# Patient Record
Sex: Male | Born: 1970 | Race: White | Hispanic: No | Marital: Married | Smoking: Former smoker
Health system: Southern US, Community
[De-identification: ages and names within clinical notes are randomized; demographics above are authoritative.]

## PROBLEM LIST (undated history)

## (undated) DIAGNOSIS — I10 Essential (primary) hypertension: Secondary | ICD-10-CM

## (undated) HISTORY — DX: Essential (primary) hypertension: I10

## (undated) HISTORY — PX: HAND SURGERY: SHX662

---

## 1999-08-15 ENCOUNTER — Ambulatory Visit (HOSPITAL_BASED_OUTPATIENT_CLINIC_OR_DEPARTMENT_OTHER): Admission: RE | Admit: 1999-08-15 | Discharge: 1999-08-15 | Payer: Self-pay | Admitting: Orthopedic Surgery

## 2017-05-09 ENCOUNTER — Emergency Department
Admission: EM | Admit: 2017-05-09 | Discharge: 2017-05-09 | Disposition: A | Discharge status: Home / Self Care | Payer: No Typology Code available for payment source | Attending: Emergency Medicine | Admitting: Emergency Medicine

## 2017-05-09 ENCOUNTER — Emergency Department: Payer: No Typology Code available for payment source

## 2017-05-09 ENCOUNTER — Encounter: Payer: Self-pay | Admitting: Emergency Medicine

## 2017-05-09 DIAGNOSIS — S82401A Unspecified fracture of shaft of right fibula, initial encounter for closed fracture: Secondary | ICD-10-CM | POA: Insufficient documentation

## 2017-05-09 DIAGNOSIS — Z87891 Personal history of nicotine dependence: Secondary | ICD-10-CM | POA: Insufficient documentation

## 2017-05-09 DIAGNOSIS — Y93H9 Activity, other involving exterior property and land maintenance, building and construction: Secondary | ICD-10-CM | POA: Diagnosis not present

## 2017-05-09 DIAGNOSIS — Y999 Unspecified external cause status: Secondary | ICD-10-CM | POA: Insufficient documentation

## 2017-05-09 DIAGNOSIS — Y929 Unspecified place or not applicable: Secondary | ICD-10-CM | POA: Diagnosis not present

## 2017-05-09 DIAGNOSIS — R1011 Right upper quadrant pain: Secondary | ICD-10-CM | POA: Insufficient documentation

## 2017-05-09 DIAGNOSIS — W228XXA Striking against or struck by other objects, initial encounter: Secondary | ICD-10-CM | POA: Diagnosis not present

## 2017-05-09 DIAGNOSIS — R918 Other nonspecific abnormal finding of lung field: Secondary | ICD-10-CM | POA: Insufficient documentation

## 2017-05-09 DIAGNOSIS — S8991XA Unspecified injury of right lower leg, initial encounter: Secondary | ICD-10-CM | POA: Diagnosis present

## 2017-05-09 LAB — CBC WITH DIFFERENTIAL/PLATELET
BASOS ABS: 0 10*3/uL (ref 0–0.1)
BASOS PCT: 0 %
Eosinophils Absolute: 0.1 10*3/uL (ref 0–0.7)
Eosinophils Relative: 1 %
HEMATOCRIT: 45.7 % (ref 40.0–52.0)
HEMOGLOBIN: 15.3 g/dL (ref 13.0–18.0)
LYMPHS PCT: 19 %
Lymphs Abs: 2 10*3/uL (ref 1.0–3.6)
MCH: 30.1 pg (ref 26.0–34.0)
MCHC: 33.5 g/dL (ref 32.0–36.0)
MCV: 90 fL (ref 80.0–100.0)
MONOS PCT: 5 %
Monocytes Absolute: 0.5 10*3/uL (ref 0.2–1.0)
NEUTROS ABS: 7.7 10*3/uL — AB (ref 1.4–6.5)
NEUTROS PCT: 75 %
Platelets: 177 10*3/uL (ref 150–440)
RBC: 5.07 MIL/uL (ref 4.40–5.90)
RDW: 13.1 % (ref 11.5–14.5)
WBC: 10.3 10*3/uL (ref 3.8–10.6)

## 2017-05-09 LAB — COMPREHENSIVE METABOLIC PANEL
ALBUMIN: 4.1 g/dL (ref 3.5–5.0)
ALK PHOS: 72 U/L (ref 38–126)
ALT: 25 U/L (ref 17–63)
AST: 25 U/L (ref 15–41)
Anion gap: 8 (ref 5–15)
BILIRUBIN TOTAL: 1.3 mg/dL — AB (ref 0.3–1.2)
BUN: 23 mg/dL — AB (ref 6–20)
CALCIUM: 8.8 mg/dL — AB (ref 8.9–10.3)
CO2: 24 mmol/L (ref 22–32)
CREATININE: 1.02 mg/dL (ref 0.61–1.24)
Chloride: 104 mmol/L (ref 101–111)
GFR calc Af Amer: >60 mL/min (ref >60)
GFR calc non Af Amer: >60 mL/min (ref >60)
GLUCOSE: 117 mg/dL — AB (ref 65–99)
Potassium: 3.9 mmol/L (ref 3.5–5.1)
Sodium: 136 mmol/L (ref 135–145)
TOTAL PROTEIN: 6.5 g/dL (ref 6.5–8.1)

## 2017-05-09 MED ORDER — IOPAMIDOL (ISOVUE-300) INJECTION 61%
100.0000 mL | Freq: Once | INTRAVENOUS | Status: AC | PRN
  Administered 2017-05-09: 100 mL via INTRAVENOUS

## 2017-05-09 MED ORDER — OXYCODONE-ACETAMINOPHEN 5-325 MG PO TABS: 1.0000 | ORAL_TABLET | Freq: Four times a day (QID) | ORAL | 0 refills | Status: AC | PRN

## 2017-05-09 MED ORDER — IOPAMIDOL (ISOVUE-300) INJECTION 61%: 125.0000 mL | Freq: Once | INTRAVENOUS | Status: DC | PRN
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
## 2017-05-09 NOTE — ED Notes (Signed)
Lab called back and stated they found blood and will be resulting tests in 15 minutes.

## 2017-05-09 NOTE — ED Notes (Signed)
Lab called regarding results, reported that no blood was received. Plan to redraw.
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
## 2017-08-14 ENCOUNTER — Emergency Department: Payer: Self-pay

## 2017-08-14 ENCOUNTER — Other Ambulatory Visit: Payer: Self-pay

## 2017-08-14 ENCOUNTER — Emergency Department
Admission: EM | Admit: 2017-08-14 | Discharge: 2017-08-14 | Disposition: A | Discharge status: Home / Self Care | Payer: Self-pay | Attending: Student in an Organized Health Care Education/Training Program | Admitting: Student in an Organized Health Care Education/Training Program

## 2017-08-14 DIAGNOSIS — R42 Dizziness and giddiness: Secondary | ICD-10-CM | POA: Insufficient documentation

## 2017-08-14 DIAGNOSIS — R5383 Other fatigue: Secondary | ICD-10-CM | POA: Insufficient documentation

## 2017-08-14 DIAGNOSIS — Z87891 Personal history of nicotine dependence: Secondary | ICD-10-CM | POA: Insufficient documentation

## 2017-08-14 LAB — TROPONIN I

## 2017-08-14 LAB — CBC
HCT: 52.6 % — ABNORMAL HIGH (ref 40.0–52.0)
HEMOGLOBIN: 17.6 g/dL (ref 13.0–18.0)
MCH: 29.8 pg (ref 26.0–34.0)
MCHC: 33.5 g/dL (ref 32.0–36.0)
MCV: 88.8 fL (ref 80.0–100.0)
PLATELETS: 209 10*3/uL (ref 150–440)
RBC: 5.93 MIL/uL — ABNORMAL HIGH (ref 4.40–5.90)
RDW: 13.3 % (ref 11.5–14.5)
WBC: 9.1 10*3/uL (ref 3.8–10.6)

## 2017-08-14 LAB — BASIC METABOLIC PANEL
ANION GAP: 11 (ref 5–15)
BUN: 17 mg/dL (ref 6–20)
CALCIUM: 9.5 mg/dL (ref 8.9–10.3)
CO2: 24 mmol/L (ref 22–32)
CREATININE: 0.9 mg/dL (ref 0.61–1.24)
Chloride: 103 mmol/L (ref 101–111)
Glucose, Bld: 118 mg/dL — ABNORMAL HIGH (ref 65–99)
Potassium: 3.9 mmol/L (ref 3.5–5.1)
SODIUM: 138 mmol/L (ref 135–145)

## 2017-08-14 LAB — TSH: TSH: 1.147 u[IU]/mL (ref 0.350–4.500)

## 2017-08-14 MED ORDER — SODIUM CHLORIDE 0.9 % IV BOLUS (SEPSIS)
1000.0000 mL | Freq: Once | INTRAVENOUS | Status: AC
  Administered 2017-08-14: 1000 mL via INTRAVENOUS

## 2017-08-14 NOTE — ED Triage Notes (Signed)
Pt c/o dizziness and lightheaded for the last 6 days - denies any other symptoms

## 2017-08-14 NOTE — Discharge Instructions (Signed)
Be sure to drink plenty of fluids.  Make appointment with your primary care physician.  Return to the ER for any worsening symptoms, questions or any additional concerns.

## 2017-08-14 NOTE — ED Provider Notes (Signed)
[facility] Emergency Department Provider Note    None    (approximate)  I have reviewed the triage vital signs and the nursing notes.   HISTORY  Chief Complaint Dizziness    HPI Wayne Frost is a 47 y.o. male previously healthy presents with chief complaint of lightheadedness particularly in the evening for the last 6 days.  States that it is a generalized feeling of fatigue and intermittent dizziness.  Denies any pains.  No headache.  No shortness of breath or chest pains.  Has been stressed because of work but is never had symptoms like this before.  He does not smoke.  Does not drink.  No numbness or tingling.  Denies any blood in his stools.  No recent fevers.  Was remotely treated for Lyme disease.  History reviewed. No pertinent past medical history. No family history on file. Past Surgical History:  Procedure Laterality Date  . HAND SURGERY Right    There are no active problems to display for this patient.     Prior to Admission medications   Medication Sig Start Date End Date Taking? Authorizing Provider  oxyCODONE-acetaminophen (ROXICET) 5-325 MG tablet Take 1 tablet by mouth every 6 (six) hours as needed. 05/09/17 05/09/18  Arnaldo NatalMalinda, Paul F, MD    Allergies Patient has no known allergies.    Social History Social History   Tobacco Use  . Smoking status: Former Games developermoker  . Smokeless tobacco: Never Used  Substance Use Topics  . Alcohol use: Yes    Comment: occassional   . Drug use: No    Review of Systems Patient denies headaches, rhinorrhea, blurry vision, numbness, shortness of breath, chest pain, edema, cough, abdominal pain, nausea, vomiting, diarrhea, dysuria, fevers, rashes or hallucinations unless otherwise stated above in HPI. ____________________________________________   PHYSICAL EXAM:  VITAL SIGNS: Vitals:   08/14/17 1337 08/14/17 1742  BP: (!) 157/102 (!) 150/85  Pulse: 87 80  Resp: 16 16  Temp: 98.1 F  (36.7 C)   SpO2: 99% 99%    Constitutional: Alert and oriented. Well appearing and in no acute distress. Eyes: Conjunctivae are normal.  Head: Atraumatic. Nose: No congestion/rhinnorhea. Mouth/Throat: Mucous membranes are moist.   Neck: No stridor. Painless ROM.  Cardiovascular: Normal rate, regular rhythm. Grossly normal heart sounds.  Good peripheral circulation. Respiratory: Normal respiratory effort.  No retractions. Lungs CTAB. Gastrointestinal: Soft and nontender. No distention. No abdominal bruits. No CVA tenderness. Genitourinary:  Musculoskeletal: No lower extremity tenderness nor edema.  No joint effusions. Neurologic:  CN- intact.  No facial droop, Normal FNF.  Normal heel to shin.  Sensation intact bilaterally. Normal speech and language. No gross focal neurologic deficits are appreciated. No gait instability. Skin:  Skin is warm, dry and intact. No rash noted. Psychiatric: Mood and affect are normal. Speech and behavior are normal.  ____________________________________________   LABS (all labs ordered are listed, but only abnormal results are displayed)  Results for orders placed or performed during the hospital encounter of 08/14/17 (from the past 24 hour(s))  Basic metabolic panel     Status: Abnormal   Collection Time: 08/14/17  1:39 PM  Result Value Ref Range   Sodium 138 135 - 145 mmol/L   Potassium 3.9 3.5 - 5.1 mmol/L   Chloride 103 101 - 111 mmol/L   CO2 24 22 - 32 mmol/L   Glucose, Bld 118 (H) 65 - 99 mg/dL   BUN 17 6 - 20 mg/dL   Creatinine, Ser  0.90 0.61 - 1.24 mg/dL   Calcium 9.5 8.9 - 16.1 mg/dL   GFR calc non Af Amer >60 >60 mL/min   GFR calc Af Amer >60 >60 mL/min   Anion gap 11 5 - 15  CBC     Status: Abnormal   Collection Time: 08/14/17  1:39 PM  Result Value Ref Range   WBC 9.1 3.8 - 10.6 K/uL   RBC 5.93 (H) 4.40 - 5.90 MIL/uL   Hemoglobin 17.6 13.0 - 18.0 g/dL   HCT 09.6 (H) 04.5 - 40.9 %   MCV 88.8 80.0 - 100.0 fL   MCH 29.8 26.0 - 34.0  pg   MCHC 33.5 32.0 - 36.0 g/dL   RDW 81.1 91.4 - 78.2 %   Platelets 209 150 - 440 K/uL  Troponin I     Status: None   Collection Time: 08/14/17  1:41 PM  Result Value Ref Range   Troponin I <0.03 <0.03 ng/mL  TSH     Status: None   Collection Time: 08/14/17  1:41 PM  Result Value Ref Range   TSH 1.147 0.350 - 4.500 uIU/mL   ____________________________________________  EKG My review and personal interpretation at Time: 13:39   Indication: weakness  Rate: 75  Rhythm: sinus Axis: normal Other: nonspecific t wave abn, no stemi ____________________________________________  RADIOLOGY  I personally reviewed all radiographic images ordered to evaluate for the above acute complaints and reviewed radiology reports and findings.  These findings were personally discussed with the patient.  Please see medical record for radiology report.  ____________________________________________   PROCEDURES  Procedure(s) performed:  Procedures    Critical Care performed: no ____________________________________________   INITIAL IMPRESSION / ASSESSMENT AND PLAN / ED COURSE  Pertinent labs & imaging results that were available during my care of the patient were reviewed by me and considered in my medical decision making (see chart for details).  DDX: Dehydration, hypothyroidism, depression, paraneoplastic syndrome.  Sepsis, pneumonia, CHF, orthostasis  Wayne Frost is a 47 y.o. who presents to the ED with his as described above.  Patient is well-appearing and in no acute distress.  Patient states he is asymptomatic at this time.  Neuro exam is nonfocal and reassuring.  Seems less consistent with TIA based on vague symptoms with no lateralizing features.  No evidence of ACS.  Thyroid was also checked which is normal.  Blood work is otherwise reassuring except that it was noted that he does have slightly concentrated CBC and I do suspect there may be some component of dehydration therefore  patient given IV fluids.  Patient able to ambulate with a steady gait.  Denies any other symptoms after observation here in the ER.  At this point I do believe he stable for further evaluation as an outpatient.  Discussed signs and symptoms for which he should return immediately to the hospital.      ____________________________________________   FINAL CLINICAL IMPRESSION(S) / ED DIAGNOSES  Final diagnoses:  Lightheadedness  Dizziness      NEW MEDICATIONS STARTED DURING THIS VISIT:  Discharge Medication List as of 08/14/2017  5:13 PM       Note:  This document was prepared using Dragon voice recognition software and may include unintentional dictation errors.    Willy Eddy, MD 08/14/17 Zollie Pee
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
## 2021-09-05 ENCOUNTER — Encounter: Payer: Self-pay | Admitting: Licensed Clinical Social Worker

## 2021-09-05 ENCOUNTER — Inpatient Hospital Stay: Payer: Medicaid Other

## 2021-09-05 ENCOUNTER — Other Ambulatory Visit: Payer: Self-pay

## 2021-09-05 ENCOUNTER — Inpatient Hospital Stay: Payer: Medicaid Other | Attending: Oncology | Admitting: Licensed Clinical Social Worker

## 2021-09-05 DIAGNOSIS — Z803 Family history of malignant neoplasm of breast: Secondary | ICD-10-CM

## 2021-09-05 DIAGNOSIS — Z800 Family history of malignant neoplasm of digestive organs: Secondary | ICD-10-CM | POA: Diagnosis not present

## 2021-09-05 NOTE — Progress Notes (Signed)
REFERRING PROVIDER: ?Marval Regal, NP ?[address],  [place] [address] ? ?PRIMARY PROVIDER:  ?Tamsen Roers, MD ? ?PRIMARY REASON FOR VISIT:  ?1. Family history of colon cancer   ? ? ? ?HISTORY OF PRESENT ILLNESS:   ?Wayne Frost, a 51 y.o. male, was seen for a [facility] cancer genetics consultation at the request of Dr. Jerelene Redden due to a family history of colon cancer.  Wayne Frost presents to clinic today to discuss the possibility of a hereditary predisposition to cancer, genetic testing, and to further clarify his future cancer risks, as well as potential cancer risks for family members.  ? ?Wayne Frost is a 51 y.o. male with no personal history of cancer.  He recently had a colonoscopy that showed 1 large polyp and will have another colonoscopy in 3 years. ? ?CANCER HISTORY:  ?Oncology History  ? No history exists.  ? ? ? ?Past Surgical History:  ?Procedure Laterality Date  ? HAND SURGERY Right   ? ? ?Social History  ? ?Socioeconomic History  ? Marital status: Married  ?  Spouse name: Not on file  ? Number of children: Not on file  ? Years of education: Not on file  ? Highest education level: Not on file  ?Occupational History  ? Not on file  ?Tobacco Use  ? Smoking status: Former  ? Smokeless tobacco: Never  ?Vaping Use  ? Vaping Use: Never used  ?Substance and Sexual Activity  ? Alcohol use: Yes  ?  Comment: occassional   ? Drug use: No  ? Sexual activity: Yes  ?Other Topics Concern  ? Not on file  ?Social History Narrative  ? Not on file  ? ?Social Determinants of Health  ? ?Financial Resource Strain: Not on file  ?Food Insecurity: Not on file  ?Transportation Needs: Not on file  ?Physical Activity: Not on file  ?Stress: Not on file  ?Social Connections: Not on file  ?  ? ?FAMILY HISTORY:  ?We obtained a detailed, 4-generation family history.  Significant diagnoses are listed below: ?Family History  ?Problem Relation Age of Onset  ? Colon cancer Mother 68  ? Colon cancer Maternal Aunt   ?  Colon cancer Maternal Uncle   ? Leukemia Paternal Uncle 4  ? Breast cancer Maternal Grandmother   ? Colon cancer Maternal Grandmother   ? Colon cancer Maternal Grandfather   ? ?Wayne Frost has 1 son, 75, and 1 daughter, 58. He has 2 sisters. Both had colonoscopies in their 30s that were normal.  ? ?Wayne Frost's mother was adopted. She had colon cancer and died at 11. Mother's biological sister informed the family that 5 brothers/sisters also died of colon cancer, patient's maternal grandfather died of colon cancer, and maternal grandmother had breast and colon cancer.  ? ?Wayne Frost father is living at 66 and has not had cancer. Patient had 2 uncles. One had leukemia and died at 58, his other uncle is living. No other known cancers on this side of the family. ? ?Mr. Foglio is unaware of previous family history of genetic testing for hereditary cancer risks. Patient's maternal ancestors are of unknown descent, and paternal ancestors are of unknown descent. There is no reported Ashkenazi Jewish ancestry. There is no known consanguinity. ? ? ? ?GENETIC COUNSELING ASSESSMENT: Wayne Frost is a 51 y.o. male with a family history of colon cancer which is somewhat suggestive of a hereditary cancer syndrome and predisposition to cancer. We, therefore, discussed and recommended the  following at today's visit.  ? ?DISCUSSION: We discussed that approximately 10% of colorectal cancer is hereditary. Most cases of hereditary colorectal cancer are associated with Lynch syndrome genes, although there are other genes associated with hereditary cancer as well. Cancers and risks are gene specific. We discussed that testing is beneficial for several reasons including knowing about cancer risks, identifying potential screening and risk-reduction options that may be appropriate, and to understand if other family members could be at risk for cancer and allow them to undergo genetic testing.  ? ?We reviewed the characteristics,  features and inheritance patterns of hereditary cancer syndromes. We also discussed genetic testing, including the appropriate family members to test, the process of testing, insurance coverage and turn-around-time for results. We discussed the implications of a negative, positive and/or variant of uncertain significant result. We recommended Wayne Frost pursue genetic testing for the [facility] Common Hereditary Cancers+RNA gene panel.  ? ?The Common Hereditary Cancers Panel + RNA offered by [facility] includes sequencing and/or deletion duplication testing of the following 47 genes: APC, ATM, AXIN2, BARD1, BMPR1A, BRCA1, BRCA2, BRIP1, CDH1, CDKN2A (p14ARF), CDKN2A (p16INK4a), CKD4, CHEK2, CTNNA1, DICER1, EPCAM (Deletion/duplication testing only), GREM1 (promoter region deletion/duplication testing only), KIT, MEN1, MLH1, MSH2, MSH3, MSH6, MUTYH, NBN, NF1, NHTL1, PALB2, PDGFRA, PMS2, POLD1, POLE, PTEN, RAD50, RAD51C, RAD51D, SDHB, SDHC, SDHD, SMAD4, SMARCA4. STK11, TP53, TSC1, TSC2, and VHL.  The following genes were evaluated for sequence changes only: SDHA and HOXB13 c.251G>A variant only. ? ?Based on Wayne Frost family history of cancer, he meets medical criteria for genetic testing. Despite that he meets criteria, he may still have an out of pocket cost. We discussed that if his out of pocket cost for testing is over $100, the laboratory will call and confirm whether he wants to proceed with testing.  If the out of pocket cost of testing is less than $100 he will be billed by the genetic testing laboratory.  ? ?PLAN: After considering the risks, benefits, and limitations, Wayne Frost provided informed consent to pursue genetic testing and the blood sample was sent to [facility] for analysis of the Common Hereditary Cancers+RNA panel. Results should be available within approximately 2-3 weeks' time, at which point they will be disclosed by telephone to Wayne Frost, as will any additional recommendations  warranted by these results. Wayne Frost will receive a summary of his genetic counseling visit and a copy of his results once available. This information will also be available in Epic.  ? ?Wayne Frost's questions were answered to his satisfaction today. Our contact information was provided should additional questions or concerns arise. Thank you for the referral and allowing [place] to share in the care of your patient.  ? ?Faith Rogue, MS, LCGC ?Genetic Counselor ?[email or web address] ?Phone: [phone] ? ?The patient was seen for a total of 25 minutes in face-to-face genetic counseling. He was seen with his wife. Dr. Grayland Ormond was available for discussion regarding this case.  ? ?_______________________________________________________________________ ?For Office Staff:  ?Number of people involved in session: 2 ?Was an Intern/ student involved with case: no ? ?

## 2021-09-25 ENCOUNTER — Ambulatory Visit: Payer: Self-pay | Admitting: Licensed Clinical Social Worker

## 2021-09-25 ENCOUNTER — Encounter: Payer: Self-pay | Admitting: Licensed Clinical Social Worker

## 2021-09-25 ENCOUNTER — Telehealth: Payer: Self-pay | Admitting: Licensed Clinical Social Worker

## 2021-09-25 DIAGNOSIS — Z1379 Encounter for other screening for genetic and chromosomal anomalies: Secondary | ICD-10-CM

## 2021-09-25 NOTE — Progress Notes (Signed)
HPI:  Wayne Frost was previously seen in the [facility] due to a family history of colon cancer and concerns regarding a hereditary predisposition to cancer. Please refer to our prior cancer genetics clinic note for more information regarding our discussion, assessment and recommendations, at the time. Wayne Frost recent genetic test results were disclosed to him, as were recommendations warranted by these results. These results and recommendations are discussed in more detail below. ? ?CANCER HISTORY:  ?Oncology History  ? No history exists.  ? ? ?FAMILY HISTORY:  ?We obtained a detailed, 4-generation family history.  Significant diagnoses are listed below: ?Family History  ?Problem Relation Age of Onset  ? Colon cancer Mother 72  ? Colon cancer Maternal Aunt   ? Colon cancer Maternal Uncle   ? Leukemia Paternal Uncle 23  ? Breast cancer Maternal Grandmother   ? Colon cancer Maternal Grandmother   ? Colon cancer Maternal Grandfather   ? ?  ?Wayne Frost has 1 son, 38, and 1 daughter, 15. He has 2 sisters. Both had colonoscopies in their 32s that were normal.  ?  ?Wayne Frost's mother was adopted. She had colon cancer and died at 32. Mother's biological sister informed the family that 5 brothers/sisters also died of colon cancer, patient's maternal grandfather died of colon cancer, and maternal grandmother had breast and colon cancer.  ?  ?Wayne Frost's father is living at 26 and has not had cancer. Patient had 2 uncles. One had leukemia and died at 64, his other uncle is living. No other known cancers on this side of the family. ?  ?Wayne Frost is unaware of previous family history of genetic testing for hereditary cancer risks. Patient's maternal ancestors are of unknown descent, and paternal ancestors are of unknown descent. There is no reported Ashkenazi Jewish ancestry. There is no known consanguinity. ?  ? ? ?GENETIC TEST RESULTS: Genetic testing reported out on 09/21/2021 through the  [facility] Common Hereditary Cancers+RNA cancer panel found no pathogenic mutations.  ? ?The Common Hereditary Cancers Panel + RNA offered by [facility] includes sequencing and/or deletion duplication testing of the following 47 genes: APC, ATM, AXIN2, BARD1, BMPR1A, BRCA1, BRCA2, BRIP1, CDH1, CDKN2A (p14ARF), CDKN2A (p16INK4a), CKD4, CHEK2, CTNNA1, DICER1, EPCAM (Deletion/duplication testing only), GREM1 (promoter region deletion/duplication testing only), KIT, MEN1, MLH1, MSH2, MSH3, MSH6, MUTYH, NBN, NF1, NHTL1, PALB2, PDGFRA, PMS2, POLD1, POLE, PTEN, RAD50, RAD51C, RAD51D, SDHB, SDHC, SDHD, SMAD4, SMARCA4. STK11, TP53, TSC1, TSC2, and VHL.  The following genes were evaluated for sequence changes only: SDHA and HOXB13 c.251G>A variant only.  ? ?The test report has been scanned into EPIC and is located under the Molecular Pathology section of the Results Review tab.  A portion of the result report is included below for reference.  ? ? ? ?We discussed that because current genetic testing is not perfect, it is possible there may be a gene mutation in one of these genes that current testing cannot detect, but that chance is small.  There could be another gene that has not yet been discovered, or that we have not yet tested, that is responsible for the cancer diagnoses in the family. It is also possible there is a hereditary cause for the cancer in the family that Wayne Frost did not inherit and therefore was not identified in his testing.  Therefore, it is important to remain in touch with cancer genetics in the future so that we can continue to offer Wayne Frost the most up to  date genetic testing.  ? ?Genetic testing did identify a variant of uncertain significance (VUS) in the BRCA2 gene called c.7643A>G.  At this time, it is unknown if this variant is associated with increased cancer risk or if this is a normal finding, but most variants such as this get reclassified to being inconsequential. It should not be used to  make medical management decisions. With time, we suspect the lab will determine the significance of this variant, if any. If we do learn more about it we will try to contact Wayne Frost to discuss it further. However, it is important to stay in touch with [place] periodically and keep the address and phone number up to date. ? ?ADDITIONAL GENETIC TESTING: We discussed with Wayne Frost that his genetic testing was fairly extensive.  If there are genes identified to increase cancer risk that can be analyzed in the future, we would be happy to discuss and coordinate this testing at that time.   ? ?CANCER SCREENING RECOMMENDATIONS: Wayne Frost test result is considered negative (normal).  This means that we have not identified a hereditary cause for his family history of cancer at this time.  ? ?While reassuring, this does not definitively rule out a hereditary predisposition to cancer. It is still possible that there could be genetic mutations that are undetectable by current technology. There could be genetic mutations in genes that have not been tested or identified to increase cancer risk.  Therefore, it is recommended he continue to follow the cancer management and screening guidelines provided by his  primary healthcare provider.  ? ?An individual's cancer risk and medical management are not determined by genetic test results alone. Overall cancer risk assessment incorporates additional factors, including personal medical history, family history, and any available genetic information that may result in a personalized plan for cancer prevention and surveillance. ? ?RECOMMENDATIONS FOR FAMILY MEMBERS:  Relatives in this family might be at some increased risk of developing cancer, over the general population risk, simply due to the family history of cancer.  We recommended male relatives in this family have a yearly mammogram beginning at age 23, or 60 years younger than the earliest onset of cancer, an annual  clinical breast exam, and perform monthly breast self-exams. Male relatives in this family should also have a gynecological exam as recommended by their primary provider.  All family members should be referred for colonoscopy starting at age 50.  ? ? It is also possible there is a hereditary cause for the cancer in Wayne Frost family that he did not inherit and therefore was not identified in him.  Based on Wayne Frost family history, we recommended his sisters have genetic counseling and testing. Wayne Frost will let us know if we can be of any assistance in coordinating genetic counseling and/or testing for these family members. ? ?FOLLOW-UP: Lastly, we discussed with Wayne Frost that cancer genetics is a rapidly advancing field and it is possible that new genetic tests will be appropriate for him and/or his family members in the future. We encouraged him to remain in contact with cancer genetics on an annual basis so we can update his personal and family histories and let him know of advances in cancer genetics that may benefit this family.  ? ?Our contact number was provided. Wayne Frost questions were answered to his satisfaction, and he knows he is welcome to call us at anytime with additional questions or concerns.  ? ?Faith Rogue, MS, LCGC ?Genetic Counselor ?[email or web address] ?  Phone: [phone] ? ?

## 2021-09-25 NOTE — Telephone Encounter (Signed)
Revealed negative genetic testing.  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 This normal result is reassuring. It is unlikely that there is an increased risk of cancer due to a mutation in one of these genes.  However, genetic testing is not perfect, and cannot definitively rule out a hereditary cause.  It will be important for him to keep in contact with genetics to learn if any additional testing may be needed in the future.    ? ?

## 2022-12-07 ENCOUNTER — Encounter: Payer: Self-pay | Admitting: Emergency Medicine

## 2022-12-07 ENCOUNTER — Other Ambulatory Visit: Payer: Self-pay

## 2022-12-07 ENCOUNTER — Emergency Department
Admission: EM | Admit: 2022-12-07 | Discharge: 2022-12-07 | Disposition: A | Discharge status: Home / Self Care | Payer: Medicaid Other | Attending: Emergency Medicine | Admitting: Emergency Medicine

## 2022-12-07 DIAGNOSIS — R42 Dizziness and giddiness: Secondary | ICD-10-CM | POA: Insufficient documentation

## 2022-12-07 LAB — COMPREHENSIVE METABOLIC PANEL
ALT: 30 U/L (ref 0–44)
AST: 22 U/L (ref 15–41)
Albumin: 4.3 g/dL (ref 3.5–5.0)
Alkaline Phosphatase: 70 U/L (ref 38–126)
Anion gap: 7 (ref 5–15)
BUN: 12 mg/dL (ref 6–20)
CO2: 23 mmol/L (ref 22–32)
Calcium: 9.2 mg/dL (ref 8.9–10.3)
Chloride: 109 mmol/L (ref 98–111)
Creatinine, Ser: 0.88 mg/dL (ref 0.61–1.24)
GFR, Estimated: >60 mL/min (ref >60)
Glucose, Bld: 115 mg/dL — ABNORMAL HIGH (ref 70–99)
Potassium: 4.3 mmol/L (ref 3.5–5.1)
Sodium: 139 mmol/L (ref 135–145)
Total Bilirubin: 2 mg/dL — ABNORMAL HIGH (ref 0.3–1.2)
Total Protein: 7 g/dL (ref 6.5–8.1)

## 2022-12-07 LAB — TROPONIN I (HIGH SENSITIVITY): Troponin I (High Sensitivity): 7 ng/L (ref <18)

## 2022-12-07 LAB — CBC
HCT: 51 % (ref 39.0–52.0)
Hemoglobin: 17 g/dL (ref 13.0–17.0)
MCH: 29.4 pg (ref 26.0–34.0)
MCHC: 33.3 g/dL (ref 30.0–36.0)
MCV: 88.1 fL (ref 80.0–100.0)
Platelets: 198 10*3/uL (ref 150–400)
RBC: 5.79 MIL/uL (ref 4.22–5.81)
RDW: 12.7 % (ref 11.5–15.5)
WBC: 8.9 10*3/uL (ref 4.0–10.5)
nRBC: 0 % (ref 0.0–0.2)

## 2022-12-07 MED ORDER — SODIUM CHLORIDE 0.9 % IV BOLUS
500.0000 mL | Freq: Once | INTRAVENOUS | Status: AC
Start: 2022-12-07 — End: 2022-12-07
  Administered 2022-12-07: 500 mL via INTRAVENOUS

## 2022-12-07 NOTE — ED Notes (Signed)
Pt in bed, pt denies pain, pt states that he is ready to go home, pt verbalized understanding d/c and follow up, pt ambulatory from department

## 2022-12-07 NOTE — ED Provider Notes (Signed)
[facility] Provider Note    Event Date/Time   First MD Initiated Contact with Patient 12/07/22 1233     (approximate)   History   Hypertension   HPI  Wayne Frost is a 52 y.o. male who presents with complaints of dizziness, high blood pressure.  Patient does report that he has been feeling this way over the last several days, he is concerned that he may have another course of [facility] spotted fever which she had last year which caused apparently similar complaints.  He denies chest pain, no nausea or vomiting.  Present today because he was having more lightheadedness than typical and felt faint     Physical Exam   Triage Vital Signs: ED Triage Vitals  Enc Vitals Group     BP 12/07/22 1224 (!) 156/107     Pulse Rate 12/07/22 1224 85     Resp 12/07/22 1224 18     Temp 12/07/22 1224 98.6 F (37 C)     Temp Source 12/07/22 1224 Oral     SpO2 12/07/22 1224 100 %     Weight 12/07/22 1221 102.1 kg (225 lb)     Height 12/07/22 1221 1.905 m (6\' 3" )     Head Circumference --      Peak Flow --      Pain Score 12/07/22 1221 0     Pain Loc --      Pain Edu? --      Excl. in GC? --     Most recent vital signs: Vitals:   12/07/22 1300 12/07/22 1305  BP:  (!) 148/89  Pulse:  71  Resp: 17 17  Temp:    SpO2:  98%     General: Awake, no distress.  CV:  Good peripheral perfusion.  Regular rate and rhythm Resp:  Normal effort.  Clear to auscultation bilaterally Abd:  No distention.  Other:     ED Results / Procedures / Treatments   Labs (all labs ordered are listed, but only abnormal results are displayed) Labs Reviewed  COMPREHENSIVE METABOLIC PANEL - Abnormal; Notable for the following components:      Result Value   Glucose, Bld 115 (*)    Total Bilirubin 2.0 (*)    All other components within normal limits  CBC  TROPONIN I (HIGH SENSITIVITY)     EKG  ED ECG REPORT I, Jene Every, the attending physician, personally viewed  and interpreted this ECG.  Date: 12/07/2022  Rhythm: normal sinus rhythm QRS Axis: normal Intervals: normal ST/T Wave abnormalities: normal Narrative Interpretation: no evidence of acute ischemia    RADIOLOGY     PROCEDURES:  Critical Care performed:   Procedures   MEDICATIONS ORDERED IN ED: Medications  sodium chloride 0.9 % bolus 500 mL (0 mLs Intravenous Stopped 12/07/22 1354)     IMPRESSION / MDM / ASSESSMENT AND PLAN / ED COURSE  I reviewed the triage vital signs and the nursing notes. Patient's presentation is most consistent with acute presentation with potential threat to life or bodily function.  Patient presents with lightheadedness as detailed above.  Overall well-appearing here in no acute distress.  Vital signs notably hypertensive, EKG is reassuring.  Treat with IV fluids, obtain labs and reevaluate  Lab work is unremarkable, normal kidney function, normal high-sensitivity troponin, normal CBC.  Feeling much better after IV fluids, no longer dizzy.  No indication for admission at this time, appropriate for discharge, will place referral for cardiology,  return precautions discussed, patient agrees to this plan.        FINAL CLINICAL IMPRESSION(S) / ED DIAGNOSES   Final diagnoses:  Dizziness     Rx / DC Orders   ED Discharge Orders          Ordered    Ambulatory referral to Cardiology       Comments: If you have not heard from the Cardiology office within the next 72 hours please call [phone].   12/07/22 1429             Note:  This document was prepared using Dragon voice recognition software and may include unintentional dictation errors.   Jene Every, MD 12/07/22 1452

## 2022-12-07 NOTE — ED Triage Notes (Signed)
Pt via POV from home. Pt pulled 2 ticks 2 weeks ago, states about a week after he started having some lightheadedness and near syncopal episodes. States today nausea and near syncopal episode today. States that he had some slightly elevated blood pressure but does not taken medications for it. Pt states that he has a hx of [place] Spotted Fever last year. States that he went to [facility] for the bloodwork but he hasn't gotten any word back. Pt is A&OX4 and NAD

## 2023-01-07 DIAGNOSIS — I10 Essential (primary) hypertension: Secondary | ICD-10-CM | POA: Insufficient documentation

## 2023-01-07 NOTE — Progress Notes (Unsigned)
Cardiology Office Note  Date:  01/08/2023   ID:  Wayne Frost, DOB [birth year], MRN [id number]  PCP:  Ailene Ravel, MD   Chief Complaint  Patient presents with   New Patient (Initial Visit)    Ref by [facility] ER & Dr. Nathanial Rancher for HTN & dizziness. Patient c/o dizziness and lightheaded when walking. Patient has noticed since the increase in Amlodipine 5 mg the dizziness seems to be better. Medications reviewed by the patient verbally.     HPI:  Mr. Wayne Frost is a 52 year old gentleman with past medical history of Hypertension Former smoker Who presents by referral by Dr. Nathanial Rancher for consultation of his dizziness, essential hypertension  Seen in the emergency room December 07, 2022 with dizziness, high blood pressure Renal function normal Treated with IV fluids with improvement of symptoms  Seen in the emergency room at [facility] for high blood pressure December 16, 2022 Blood pressure 140/95 He was started on amlodipine 2.5 daily  Intolerance of lisinopril, rash on his chest after 1 day Prescribed losartan, did not start  [place] spotted fever last year, high pressure, labile Tick bites this year Several months of sx Finished doxy 2 weeks  When active, BP elevated, lightheaded Better on amlodipine 5 mg for the past week, still there a little bit  Very active at baseline, denies shortness of breath or chest pain on exertion Blood pressure continues to run high as demonstrated today 150 over 90s  EKG personally reviewed by myself on todays visit EKG Interpretation Date/Time:  Wednesday January 08 2023 14:16:18 EDT Ventricular Rate:  100 PR Interval:  160 QRS Duration:  84 QT Interval:  338 QTC Calculation: 436 R Axis:   32  Text Interpretation: Normal sinus rhythm Normal ECG When compared with ECG of 07-Dec-2022 12:22, Nonspecific T wave abnormality no longer evident in Lateral leads Confirmed by Julien Nordmann [phone]) on 01/08/2023 2:45:25 PM    PMH:   has a past medical  history of Hypertension.  PSH:    Past Surgical History:  Procedure Laterality Date   HAND SURGERY Right     Current Outpatient Medications  Medication Sig Dispense Refill   ALPRAZolam (XANAX) 0.5 MG tablet Take 0.5 mg by mouth at bedtime as needed for anxiety.     amLODipine (NORVASC) 5 MG tablet Take 5 mg by mouth daily.     aspirin EC (ASPIRIN LOW DOSE) 81 MG tablet Take 81 mg by mouth daily. Swallow whole.     No current facility-administered medications for this visit.     Allergies:   Pseudoephedrine hcl, Sudafed [pseudoephedrine], and Lisinopril   Social History:  The patient  reports that he has quit smoking. He has never used smokeless tobacco. He reports that he does not currently use alcohol. He reports that he does not use drugs.   Family History:   family history includes Breast cancer in his maternal grandmother; Colon cancer in his maternal aunt, maternal grandfather, maternal grandmother, and maternal uncle; Colon cancer (age of onset: 29) in his mother; Heart attack in his paternal grandfather; Leukemia (age of onset: 70) in his paternal uncle; Parkinson's disease in his father; Stroke in his mother.    Review of Systems: Review of Systems  Constitutional: Negative.   HENT: Negative.    Respiratory: Negative.    Cardiovascular: Negative.   Gastrointestinal: Negative.   Musculoskeletal: Negative.   Neurological:  Positive for dizziness.  Psychiatric/Behavioral: Negative.    All other systems reviewed and are negative.  PHYSICAL EXAM: VS:  BP (!) 140/88 (BP Location: Right Arm, Patient Position: Sitting, Cuff Size: Normal)   Pulse 100   Ht 6\' 2"  (1.88 m)   Wt 224 lb 8 oz (101.8 kg)   SpO2 97%   BMI 28.82 kg/m  , BMI Body mass index is 28.82 kg/m. GEN: Well nourished, well developed, in no acute distress HEENT: normal Neck: no JVD, carotid bruits, or masses Cardiac: RRR; no murmurs, rubs, or gallops,no edema  Respiratory:  clear to auscultation  bilaterally, normal work of breathing GI: soft, nontender, nondistended, + BS MS: no deformity or atrophy Skin: warm and dry, no rash Neuro:  Strength and sensation are intact Psych: euthymic mood, full affect   Recent Labs: 12/07/2022: ALT 30; BUN 12; Creatinine, Ser 0.88; Hemoglobin 17.0; Platelets 198; Potassium 4.3; Sodium 139    Lipid Panel No results found for: "CHOL", "HDL", "LDLCALC", "TRIG"    Wt Readings from Last 3 Encounters:  01/08/23 224 lb 8 oz (101.8 kg)  12/07/22 225 lb (102.1 kg)  08/14/17 216 lb (98 kg)     ASSESSMENT AND PLAN:  Problem List Items Addressed This Visit       Cardiology Problems   Essential hypertension - Primary   Relevant Medications   amLODipine (NORVASC) 5 MG tablet   aspirin EC (ASPIRIN LOW DOSE) 81 MG tablet   Other Relevant Orders   EKG 12-Lead (Completed)   Essential hypertension Recommend we increase amlodipine up to 5 twice daily or 10 daily May need additional medication for blood pressure control Suggested he closely monitor blood pressure at home and call us with numbers  Dizziness Feels it may be correlating to high blood pressure Symptoms improving with better blood pressure control Orthostatics negative today Most blood pressures 150s over 90s with change in position, standing  Former smoker Quit sometime ago  Cardiac risk factors CT coronary calcium score ordered for risk stratification  [place] spotted fever/Lyme disease Treated aggressively last year with improvement of his symptoms, treated again this year after several tick bites    Total encounter time more than 60 minutes  Greater than 50% was spent in counseling and coordination of care with the patient  Patient is seen in consultation for Dr. Nathanial Rancher and be referred back to her office for ongoing care of the issues detailed above  Signed, Dossie Arbour, M.D., Ph.D. [facility] [place], [place] [phone]

## 2023-01-08 ENCOUNTER — Ambulatory Visit: Payer: Medicaid Other | Attending: Cardiovascular Disease | Admitting: Cardiovascular Disease

## 2023-01-08 ENCOUNTER — Encounter: Payer: Self-pay | Admitting: Cardiovascular Disease

## 2023-01-08 VITALS — BP 140/88 | HR 100 | Ht 74.0 in | Wt 224.5 lb

## 2023-01-08 DIAGNOSIS — I10 Essential (primary) hypertension: Secondary | ICD-10-CM

## 2023-01-08 DIAGNOSIS — R42 Dizziness and giddiness: Secondary | ICD-10-CM | POA: Diagnosis not present

## 2023-01-08 MED ORDER — AMLODIPINE BESYLATE 5 MG PO TABS: 5.0000 mg | ORAL_TABLET | Freq: Two times a day (BID) | ORAL | 3 refills | Status: DC

## 2023-01-08 NOTE — Patient Instructions (Addendum)
Medication Instructions:  Amlodipine up to 5 mg twice a day  If you need a refill on your cardiac medications before your next appointment, please call your pharmacy.   Lab work: No new labs needed  Testing/Procedures:  CT coronary calcium score.   - $99 out of pocket cost at the time of your test - Call [phone] to schedule at your convenience.  [facility] [address] [place], [place] [address]   Coronary CalciumScan A coronary calcium scan is an imaging test used to look for deposits of calcium and other fatty materials (plaques) in the inner lining of the blood vessels of the heart (coronary arteries). These deposits of calcium and plaques can partly clog and narrow the coronary arteries without producing any symptoms or warning signs. This puts a person at risk for a heart attack. This test can detect these deposits before symptoms develop. Tell a health care provider about: Any allergies you have. All medicines you are taking, including vitamins, herbs, eye drops, creams, and over-the-counter medicines. Any problems you or family members have had with anesthetic medicines. Any blood disorders you have. Any surgeries you have had. Any medical conditions you have. Whether you are pregnant or may be pregnant. What are the risks? Generally, this is a safe procedure. However, problems may occur, including: Harm to a pregnant woman and her unborn baby. This test involves the use of radiation. Radiation exposure can be dangerous to a pregnant woman and her unborn baby. If you are pregnant, you generally should not have this procedure done. Slight increase in the risk of cancer. This is because of the radiation involved in the test. What happens before the procedure? No preparation is needed for this procedure. What happens during the procedure? You will undress and remove any jewelry around your neck or chest. You will put on a  hospital gown. Sticky electrodes will be placed on your chest. The electrodes will be connected to an electrocardiogram (ECG) machine to record a tracing of the electrical activity of your heart. A CT scanner will take pictures of your heart. During this time, you will be asked to lie still and hold your breath for 2-3 seconds while a picture of your heart is being taken. The procedure may vary among health care providers and hospitals. What happens after the procedure? You can get dressed. You can return to your normal activities. It is up to you to get the results of your test. Ask your health care provider, or the department that is doing the test, when your results will be ready. Summary A coronary calcium scan is an imaging test used to look for deposits of calcium and other fatty materials (plaques) in the inner lining of the blood vessels of the heart (coronary arteries). Generally, this is a safe procedure. Tell your health care provider if you are pregnant or may be pregnant. No preparation is needed for this procedure. A CT scanner will take pictures of your heart. You can return to your normal activities after the scan is done. This information is not intended to replace advice given to you by your health care provider. Make sure you discuss any questions you have with your health care provider. Document Released: 11/23/2007 Document Revised: 04/15/2016 Document Reviewed: 04/15/2016 Elsevier Interactive Patient Education  2017 [employer].   Follow-Up: At [facility], you and your health needs are our priority.  As part of our continuing mission to provide you with exceptional  heart care, we have created designated Provider Care Teams.  These Care Teams include your primary Cardiologist (physician) and Advanced Practice Providers (APPs -  Physician Assistants and Nurse Practitioners) who all work together to provide you with the care you need, when you need it.  You will need a  follow up appointment as needed  Providers on your designated Care Team:   Nicolasa Ducking, NP Eula Listen, PA-C Cadence Fransico Michael, [place]  COVID-19 Vaccine Information can be found at: PodExchange.[email or web address] For questions related to vaccine distribution or appointments, please email [email or web address] or call [phone].

## 2023-01-14 ENCOUNTER — Ambulatory Visit
Admission: RE | Admit: 2023-01-14 | Discharge: 2023-01-14 | Disposition: A | Discharge status: Home / Self Care | Payer: Medicaid Other | Source: Ambulatory Visit | Attending: Cardiovascular Disease | Admitting: Cardiovascular Disease

## 2023-01-14 DIAGNOSIS — I10 Essential (primary) hypertension: Secondary | ICD-10-CM | POA: Insufficient documentation

## 2023-01-16 ENCOUNTER — Ambulatory Visit: Payer: Medicaid Other | Admitting: Cardiovascular Disease

## 2023-03-13 ENCOUNTER — Other Ambulatory Visit: Payer: Self-pay | Admitting: Family Medicine

## 2023-03-13 DIAGNOSIS — R519 Headache, unspecified: Secondary | ICD-10-CM

## 2023-12-19 ENCOUNTER — Telehealth: Payer: Self-pay | Admitting: Cardiovascular Disease

## 2023-12-19 NOTE — Telephone Encounter (Signed)
 Pt has been experiencing some lightheadedness and wants to speak with nurse to be advised on what to do. He also stated a stress test was mentioned before but no order was ever placed. Please advise.

## 2023-12-19 NOTE — Telephone Encounter (Signed)
 Called and spoke with wife per DPR. Wife reports that patient has been feeling dizzy times three weeks. Wife reports symptoms are worse in the morning. Wife reports that blood pressure has been up and down. Does not have any vital signs to report. Patient scheduled to be seen in  clinic on 12/22/23. Recommend patient keep a log of blood pressures and heart rates and bring to upcoming appointment. Wife made aware of ED precaution should any new symptoms develop or worsen.  Wife verbalizes understanding.

## 2023-12-21 NOTE — Progress Notes (Unsigned)
 Cardiology Office Note  Date:  12/22/2023   ID:  Wayne Frost, DOB Sep 14, 1970, MRN [id number]  PCP:  Stephanie Charlene CROME, MD   Chief Complaint  Patient presents with   Dizziness    Patient c/o dizziness with recently losing 20 lbs; he has been trying to lose weight and has noticed his blood pressure running too low causing the dizziness.     HPI:  Wayne Frost is a 53 year old gentleman with past medical history of Hypertension Former smoker Who presents for f/u of his dizziness, essential hypertension  Last seen by myself in clinic July 2024 Lost 20 pounds, low blood pressure Was getting dizzy Decreased amlodipine  2.5 QHS BP still low Then started amlodipine  2.5 every other day Stopped amlodipine , BP creeps up 168 systolic, took amlodipine , came back down Wonders if he needs a lower dose 1.25 daily  Working outside in the hot sun at times  Previously seen in the emergency room's for high blood pressure Seems to be labile Intolerant of lisinopril, had rash  EKG personally reviewed by myself on todays visit EKG Interpretation Date/Time:  Monday December 22 2023 15:15:43 EDT Ventricular Rate:  74 PR Interval:  156 QRS Duration:  84 QT Interval:  376 QTC Calculation: 417 R Axis:   37  Text Interpretation: Normal sinus rhythm with sinus arrhythmia Normal ECG When compared with ECG of 08-Jan-2023 14:16, No significant change was found Confirmed by Perla Lye [phone]) on 12/22/2023 3:39:39 PM    PMH:   has a past medical history of Hypertension.  PSH:    Past Surgical History:  Procedure Laterality Date   HAND SURGERY Right     Current Outpatient Medications  Medication Sig Dispense Refill   ALPRAZolam (XANAX) 0.5 MG tablet Take 0.5 mg by mouth at bedtime as needed for anxiety.     amLODipine  (NORVASC ) 5 MG tablet Take 2.5 mg by mouth daily.     aspirin EC (ASPIRIN LOW DOSE) 81 MG tablet Take 81 mg by mouth daily. Swallow whole.     No current  facility-administered medications for this visit.     Allergies:   Pseudoephedrine hcl, Sudafed [pseudoephedrine], and Lisinopril   Social History:  The patient  reports that he has quit smoking. He has never used smokeless tobacco. He reports that he does not currently use alcohol. He reports that he does not use drugs.   Family History:   family history includes Breast cancer in his maternal grandmother; Colon cancer in his maternal aunt, maternal grandfather, maternal grandmother, and maternal uncle; Colon cancer (age of onset: 46) in his mother; Heart attack in his paternal grandfather; Leukemia (age of onset: 70) in his paternal uncle; Parkinson's disease in his father; Stroke in his mother.    Review of Systems: Review of Systems  Constitutional: Negative.   HENT: Negative.    Respiratory: Negative.    Cardiovascular: Negative.   Gastrointestinal: Negative.   Musculoskeletal: Negative.   Neurological:  Positive for dizziness.  Psychiatric/Behavioral: Negative.    All other systems reviewed and are negative.   PHYSICAL EXAM: VS:  BP 130/80 (BP Location: Left Arm, Patient Position: Sitting, Cuff Size: Normal)   Pulse 74   Ht 6' 2 (1.88 m)   Wt 219 lb 4 oz (99.5 kg)   SpO2 98%   BMI 28.15 kg/m  , BMI Body mass index is 28.15 kg/m. Constitutional:  oriented to person, place, and time. No distress.  HENT:  Head: Grossly normal Eyes:  no  discharge. No scleral icterus.  Neck: No JVD, no carotid bruits  Cardiovascular: Regular rate and rhythm, no murmurs appreciated Pulmonary/Chest: Clear to auscultation bilaterally, no wheezes or rails Abdominal: Soft.  no distension.  no tenderness.  Musculoskeletal: Normal range of motion Neurological:  normal muscle tone. Coordination normal. No atrophy Skin: Skin warm and dry Psychiatric: normal affect, pleasant  Recent Labs: No results found for requested labs within last 365 days.    Lipid Panel No results found for: CHOL,  HDL, LDLCALC, TRIG    Wt Readings from Last 3 Encounters:  12/22/23 219 lb 4 oz (99.5 kg)  01/08/23 224 lb 8 oz (101.8 kg)  12/07/22 225 lb (102.1 kg)     ASSESSMENT AND PLAN:  Problem List Items Addressed This Visit       Cardiology Problems   Essential hypertension - Primary   Relevant Medications   amLODipine  (NORVASC ) 5 MG tablet   Other Relevant Orders   EKG 12-Lead (Completed)   Other Visit Diagnoses       Dizziness       Relevant Orders   EKG 12-Lead (Completed)       Essential hypertension 20 pound weight loss likely affecting blood pressure, now running lower Recommend he decrease amlodipine  down to 1.25 mg daily, extra amlodipine  as needed for high blood pressure  Former smoker Quit sometime ago  Cardiac risk factors Calcium score of 0 in 2024 No further workup needed at this time  [facility] spotted fever/Lyme disease Previously treated  Signed, Velinda Lunger, M.D., Ph.D. [facility] [place], [place] [phone]

## 2023-12-22 ENCOUNTER — Encounter: Payer: Self-pay | Admitting: Cardiovascular Disease

## 2023-12-22 ENCOUNTER — Ambulatory Visit: Attending: Cardiovascular Disease | Admitting: Cardiovascular Disease

## 2023-12-22 VITALS — BP 130/80 | HR 74 | Ht 74.0 in | Wt 219.2 lb

## 2023-12-22 DIAGNOSIS — R42 Dizziness and giddiness: Secondary | ICD-10-CM | POA: Diagnosis not present

## 2023-12-22 DIAGNOSIS — I10 Essential (primary) hypertension: Secondary | ICD-10-CM | POA: Insufficient documentation

## 2023-12-22 MED ORDER — AMLODIPINE BESYLATE 2.5 MG PO TABS: 1.2500 mg | ORAL_TABLET | Freq: Every day | ORAL | 3 refills | Status: AC

## 2023-12-22 NOTE — Patient Instructions (Addendum)
 Medication Instructions:   Please decrease the amlodipine  down to 1.25 mg daily, extra 1.25 mg as needed  If you need a refill on your cardiac medications before your next appointment, please call your pharmacy.   Lab work: No new labs needed  Testing/Procedures: No new testing needed  Follow-Up: At [facility], you and your health needs are our priority.  As part of our continuing mission to provide you with exceptional heart care, we have created designated Provider Care Teams.  These Care Teams include your primary Cardiologist (physician) and Advanced Practice Providers (APPs -  Physician Assistants and Nurse Practitioners) who all work together to provide you with the care you need, when you need it.  You will need a follow up appointment in 12 months  Providers on your designated Care Team:   Lonni Meager, NP Bernardino Bring, PA-C Cadence [place], [place]  COVID-19 Vaccine Information can be found at: [email or web address] For questions related to vaccine distribution or appointments, please email [email or web address] or call [phone].

## 2023-12-24 NOTE — Telephone Encounter (Signed)
 Patient seen in clinic all questions and concerns addressed at that time.

## 2024-06-29 ENCOUNTER — Ambulatory Visit: Admitting: Cardiovascular Disease
